# Patient Record
Sex: Female | Born: 1995 | Race: Black or African American | Hispanic: No | Marital: Single | Smoking: Never smoker
Health system: Southern US, Community
[De-identification: ages and names within clinical notes are randomized; demographics above are authoritative.]

---

## 2017-02-12 ENCOUNTER — Emergency Department (HOSPITAL_BASED_OUTPATIENT_CLINIC_OR_DEPARTMENT_OTHER)
Admission: EM | Admit: 2017-02-12 | Discharge: 2017-02-12 | Disposition: A | Discharge status: Home / Self Care | Payer: Self-pay | Attending: Emergency Medicine | Admitting: Emergency Medicine

## 2017-02-12 ENCOUNTER — Other Ambulatory Visit: Payer: Self-pay

## 2017-02-12 ENCOUNTER — Encounter (HOSPITAL_BASED_OUTPATIENT_CLINIC_OR_DEPARTMENT_OTHER): Payer: Self-pay | Admitting: *Deleted

## 2017-02-12 DIAGNOSIS — R197 Diarrhea, unspecified: Secondary | ICD-10-CM

## 2017-02-12 DIAGNOSIS — E860 Dehydration: Secondary | ICD-10-CM | POA: Insufficient documentation

## 2017-02-12 DIAGNOSIS — R112 Nausea with vomiting, unspecified: Secondary | ICD-10-CM

## 2017-02-12 LAB — COMPREHENSIVE METABOLIC PANEL
ALBUMIN: 3.9 g/dL (ref 3.5–5.0)
ALT: 11 U/L — AB (ref 14–54)
AST: 18 U/L (ref 15–41)
Alkaline Phosphatase: 45 U/L (ref 38–126)
Anion gap: 9 (ref 5–15)
BILIRUBIN TOTAL: 0.6 mg/dL (ref 0.3–1.2)
BUN: 7 mg/dL (ref 6–20)
CHLORIDE: 107 mmol/L (ref 101–111)
CO2: 22 mmol/L (ref 22–32)
CREATININE: 0.9 mg/dL (ref 0.44–1.00)
Calcium: 8.9 mg/dL (ref 8.9–10.3)
GFR calc Af Amer: >60 mL/min (ref >60)
GLUCOSE: 95 mg/dL (ref 65–99)
POTASSIUM: 3.7 mmol/L (ref 3.5–5.1)
Sodium: 138 mmol/L (ref 135–145)
Total Protein: 7.6 g/dL (ref 6.5–8.1)

## 2017-02-12 LAB — CBC WITH DIFFERENTIAL/PLATELET
Basophils Absolute: 0 10*3/uL (ref 0.0–0.1)
Basophils Relative: 0 %
EOS ABS: 0 10*3/uL (ref 0.0–0.7)
EOS PCT: 1 %
HCT: 36.3 % (ref 36.0–46.0)
Hemoglobin: 12.1 g/dL (ref 12.0–15.0)
LYMPHS ABS: 1.2 10*3/uL (ref 0.7–4.0)
LYMPHS PCT: 19 %
MCH: 26.9 pg (ref 26.0–34.0)
MCHC: 33.3 g/dL (ref 30.0–36.0)
MCV: 80.7 fL (ref 78.0–100.0)
Monocytes Absolute: 0.5 10*3/uL (ref 0.1–1.0)
Monocytes Relative: 7 %
Neutro Abs: 4.8 10*3/uL (ref 1.7–7.7)
Neutrophils Relative %: 73 %
PLATELETS: 295 10*3/uL (ref 150–400)
RBC: 4.5 MIL/uL (ref 3.87–5.11)
RDW: 13.2 % (ref 11.5–15.5)
WBC: 6.5 10*3/uL (ref 4.0–10.5)

## 2017-02-12 LAB — URINALYSIS, ROUTINE W REFLEX MICROSCOPIC
Bilirubin Urine: NEGATIVE
GLUCOSE, UA: NEGATIVE mg/dL
Ketones, ur: NEGATIVE mg/dL
Leukocytes, UA: NEGATIVE
Nitrite: NEGATIVE
PROTEIN: NEGATIVE mg/dL
Specific Gravity, Urine: >1.03 — ABNORMAL HIGH (ref 1.005–1.030)
pH: 6 (ref 5.0–8.0)

## 2017-02-12 LAB — URINALYSIS, MICROSCOPIC (REFLEX)

## 2017-02-12 LAB — PREGNANCY, URINE: PREG TEST UR: NEGATIVE

## 2017-02-12 MED ORDER — ONDANSETRON HCL 4 MG/2ML IJ SOLN
4.0000 mg | Freq: Once | INTRAMUSCULAR | Status: AC
Start: 2017-02-12 — End: 2017-02-12
  Administered 2017-02-12: 4 mg via INTRAVENOUS
  Filled 2017-02-12: qty 2

## 2017-02-12 MED ORDER — LACTATED RINGERS IV BOLUS (SEPSIS)
1000.0000 mL | Freq: Once | INTRAVENOUS | Status: AC
  Administered 2017-02-12: 1000 mL via INTRAVENOUS

## 2017-02-12 NOTE — ED Notes (Signed)
Patient given water for PO challenge.  

## 2017-02-12 NOTE — ED Triage Notes (Signed)
Pt reports 2 days of n/v/d saw her pcp yesterday, dx with "stomach bug" rx nausea and diarrhea meds, did not pick up her prescriptions because she felt better after her apt, but symptoms returned today. Pt states her niece has had same sx, "I think she gave this to me."

## 2017-02-12 NOTE — ED Provider Notes (Signed)
[facility] EMERGENCY DEPARTMENT Provider Note   CSN: [id number] Arrival date & time: 02/12/17  0831     History   Chief Complaint Chief Complaint  Patient presents with  . Emesis    HPI Rhonda Powers is a 22 y.o. female.  The history is provided by the patient. No language interpreter was used.  Emesis      Rhonda Powers is a 22 y.o. female who presents to the Emergency Department complaining of vomiting/diarrhea.  She reports 2 days have nausea, vomiting, diarrhea.  She has had 2 episodes of emesis daily with numerous episodes of diarrhea.  She does endorse sick contacts with similar symptoms.  She went to her doctor yesterday for the symptoms that she was starting to feel better and did not take the nausea medicine that was prescribed.  This morning she felt significant worsening with recurrent vomiting and diarrhea.  She reports feeling warm at times with occasional chills but no reports of fever.  She reports feeling very tired today.  No dysuria, abdominal pain.  No recent antibiotic use or foreign travel.  She has no medical problems.  Symptoms are moderate, constant, worsening.  History reviewed. No pertinent past medical history.  There are no active problems to display for this patient.   History reviewed. No pertinent surgical history.  OB History    No data available       Home Medications    Prior to Admission medications   Not on File    Family History History reviewed. No pertinent family history.  Social History Social History   Tobacco Use  . Smoking status: Never Smoker  . Smokeless tobacco: Never Used  Substance Use Topics  . Alcohol use: Not on file  . Drug use: Not on file     Allergies   Patient has no known allergies.   Review of Systems Review of Systems  Gastrointestinal: Positive for vomiting.  All other systems reviewed and are negative.    Physical Exam Updated Vital Signs BP (!) 105/56   Pulse (!) 51    Temp 97.9 F (36.6 C) (Oral)   Resp 18   LMP 01/18/2017   SpO2 98%   Physical Exam  Constitutional: She is oriented to person, place, and time. She appears well-developed and well-nourished.  HENT:  Head: Normocephalic and atraumatic.  Cardiovascular: Normal rate and regular rhythm.  No murmur heard. Pulmonary/Chest: Effort normal and breath sounds normal. No respiratory distress.  Abdominal: Soft. There is no tenderness. There is no rebound and no guarding.  Musculoskeletal: She exhibits no edema or tenderness.  Neurological: She is alert and oriented to person, place, and time.  Skin: Skin is warm and dry.  Psychiatric: She has a normal mood and affect. Her behavior is normal.  Nursing note and vitals reviewed.    ED Treatments / Results  Labs (all labs ordered are listed, but only abnormal results are displayed) Labs Reviewed  URINALYSIS, ROUTINE W REFLEX MICROSCOPIC - Abnormal; Notable for the following components:      Result Value   APPearance CLOUDY (*)    Specific Gravity, Urine >1.030 (*)    Hgb urine dipstick MODERATE (*)    All other components within normal limits  URINALYSIS, MICROSCOPIC (REFLEX) - Abnormal; Notable for the following components:   Bacteria, UA MANY (*)    Squamous Epithelial / LPF 6-30 (*)    All other components within normal limits  COMPREHENSIVE METABOLIC PANEL - Abnormal; Notable for the  following components:   ALT 11 (*)    All other components within normal limits  PREGNANCY, URINE  CBC WITH DIFFERENTIAL/PLATELET    EKG  EKG Interpretation None       Radiology No results found.  Procedures Procedures (including critical care time)  Medications Ordered in ED Medications  lactated ringers bolus 1,000 mL (1,000 mLs Intravenous New Bag/Given 02/12/17 0942)  ondansetron (ZOFRAN) injection 4 mg (4 mg Intravenous Given 02/12/17 0942)     Initial Impression / Assessment and Plan / ED Course  I have reviewed the triage vital  signs and the nursing notes.  Pertinent labs & imaging results that were available during my care of the patient were reviewed by me and considered in my medical decision making (see chart for details).     Patient here for evaluation of nausea, vomiting, diarrhea with sick contact with similar symptoms.  Abdominal examination is benign.  Current presentation is not consistent with acute cholecystitis, acute appendicitis, bowel obstruction.  UA is not consistent with UTI in setting of her current symptoms.  UA is concentrated consistent with dehydration, provided IV fluid bolus as well as antiemetic.  On repeat assessment after IV fluid she is feeling improved.  Discussed home care for vomiting and diarrhea, likely gastroenteritis.  Discussed oral fluid hydration, outpatient follow-up and return precautions.  Final Clinical Impressions(s) / ED Diagnoses   Final diagnoses:  Nausea vomiting and diarrhea  Dehydration    ED Discharge Orders    None       Tilden Fossaees, Beverely Suen, MD 02/12/17 1031

## 2018-01-24 ENCOUNTER — Other Ambulatory Visit: Payer: Self-pay

## 2018-01-24 ENCOUNTER — Emergency Department (HOSPITAL_COMMUNITY)
Admission: EM | Admit: 2018-01-24 | Discharge: 2018-01-25 | Disposition: A | Discharge status: Home / Self Care | Payer: BLUE CROSS/BLUE SHIELD | Attending: Emergency Medicine | Admitting: Emergency Medicine

## 2018-01-24 ENCOUNTER — Encounter (HOSPITAL_COMMUNITY): Payer: Self-pay | Admitting: Emergency Medicine

## 2018-01-24 DIAGNOSIS — R112 Nausea with vomiting, unspecified: Secondary | ICD-10-CM | POA: Diagnosis present

## 2018-01-24 DIAGNOSIS — K047 Periapical abscess without sinus: Secondary | ICD-10-CM | POA: Diagnosis not present

## 2018-01-24 DIAGNOSIS — E860 Dehydration: Secondary | ICD-10-CM

## 2018-01-24 NOTE — ED Triage Notes (Addendum)
C/o R lower dental pain/ ? Abscess since yesterday.  Reports vomiting mucous and chills today.  Denies fever.

## 2018-01-24 NOTE — ED Notes (Signed)
Pt reports having an abscess tooth that has been leaking. Pt reports a dental appointment for same next week. Pt was concerned because same stopped draining but started again today. Pt reports taking a 7 hour nap and when she woke up she started to vomit and was sweaty. Pt denies any fever or pain.

## 2018-01-25 LAB — COMPREHENSIVE METABOLIC PANEL
ALBUMIN: 3.9 g/dL (ref 3.5–5.0)
ALK PHOS: 35 U/L — AB (ref 38–126)
ALT: 14 U/L (ref 0–44)
AST: 15 U/L (ref 15–41)
Anion gap: 9 (ref 5–15)
BILIRUBIN TOTAL: 0.6 mg/dL (ref 0.3–1.2)
BUN: 12 mg/dL (ref 6–20)
CO2: 23 mmol/L (ref 22–32)
Calcium: 9 mg/dL (ref 8.9–10.3)
Chloride: 105 mmol/L (ref 98–111)
Creatinine, Ser: 0.87 mg/dL (ref 0.44–1.00)
GFR calc Af Amer: >60 mL/min (ref >60)
GFR calc non Af Amer: >60 mL/min (ref >60)
GLUCOSE: 86 mg/dL (ref 70–99)
POTASSIUM: 3.9 mmol/L (ref 3.5–5.1)
Sodium: 137 mmol/L (ref 135–145)
TOTAL PROTEIN: 7.6 g/dL (ref 6.5–8.1)

## 2018-01-25 LAB — CBC
HCT: 41.6 % (ref 36.0–46.0)
HEMOGLOBIN: 12.9 g/dL (ref 12.0–15.0)
MCH: 26.2 pg (ref 26.0–34.0)
MCHC: 31 g/dL (ref 30.0–36.0)
MCV: 84.6 fL (ref 80.0–100.0)
Platelets: 281 10*3/uL (ref 150–400)
RBC: 4.92 MIL/uL (ref 3.87–5.11)
RDW: 13.5 % (ref 11.5–15.5)
WBC: 7 10*3/uL (ref 4.0–10.5)
nRBC: 0 % (ref 0.0–0.2)

## 2018-01-25 LAB — I-STAT BETA HCG BLOOD, ED (MC, WL, AP ONLY)

## 2018-01-25 LAB — LIPASE, BLOOD: Lipase: 26 U/L (ref 11–51)

## 2018-01-25 MED ORDER — SODIUM CHLORIDE 0.9 % IV BOLUS
1000.0000 mL | Freq: Once | INTRAVENOUS | Status: AC
  Administered 2018-01-25: 1000 mL via INTRAVENOUS

## 2018-01-25 MED ORDER — ONDANSETRON HCL 4 MG/2ML IJ SOLN
4.0000 mg | Freq: Once | INTRAMUSCULAR | Status: AC | PRN
  Administered 2018-01-25: 4 mg via INTRAVENOUS
  Filled 2018-01-25: qty 2

## 2018-01-25 MED ORDER — PROMETHAZINE HCL 25 MG PO TABS: 25.0000 mg | ORAL_TABLET | Freq: Four times a day (QID) | ORAL | 0 refills | Status: AC | PRN

## 2018-01-25 MED ORDER — ONDANSETRON HCL 4 MG/2ML IJ SOLN: 4.0000 mg | Freq: Once | INTRAMUSCULAR | Status: DC

## 2018-01-25 MED ORDER — PENICILLIN V POTASSIUM 500 MG PO TABS: 500.0000 mg | ORAL_TABLET | Freq: Four times a day (QID) | ORAL | 0 refills | Status: DC

## 2018-01-25 NOTE — ED Provider Notes (Signed)
[facility]   CSN: [id number] Arrival date & time: 01/24/18  2207     History   Chief Complaint Chief Complaint  Patient presents with  . Dental Pain  . Emesis    HPI Rhonda Powers is a 22 y.o. female.  HPI Patient presents to the emergency department with nausea and vomiting that started earlier this morning.  The patient states that she had a dental abscess that ruptured last night.  This morning woke up with some chills and nausea vomiting.  The patient states that her mouth feels better does not hurt at this time.  Patient states she did not take any medications prior to arrival for symptoms.  She does have an appointment with her dentist next week.  Patient states that she does not believe she had a fever.  Patient states this time she is having body aches but no nausea.  The patient denies chest pain, shortness of breath, headache,blurred vision, neck pain, fever, cough, weakness, numbness, dizziness, anorexia, edema, abdominal pain, diarrhea, rash, back pain, dysuria, hematemesis, bloody stool, near syncope, or syncope. History reviewed. No pertinent past medical history.  There are no active problems to display for this patient.   History reviewed. No pertinent surgical history.   OB History   No obstetric history on file.      Home Medications    Prior to Admission medications   Not on File    Family History No family history on file.  Social History Social History   Tobacco Use  . Smoking status: Never Smoker  . Smokeless tobacco: Never Used  Substance Use Topics  . Alcohol use: Not Currently  . Drug use: Not Currently     Allergies   Patient has no known allergies.   Review of Systems Review of Systems All other systems negative except as documented in the HPI. All pertinent positives and negatives as reviewed in the HPI.  Physical Exam Updated Vital Signs BP 115/75   Pulse 62   Temp  98.1 F (36.7 C) (Oral)   Resp 18   LMP 12/27/2017   SpO2 100%   Physical Exam Vitals signs and nursing note reviewed.  Constitutional:      General: She is not in acute distress.    Appearance: She is well-developed.  HENT:     Head: Normocephalic and atraumatic.     Mouth/Throat:   Eyes:     Pupils: Pupils are equal, round, and reactive to light.  Neck:     Musculoskeletal: Normal range of motion and neck supple.  Cardiovascular:     Rate and Rhythm: Normal rate and regular rhythm.     Heart sounds: Normal heart sounds. No murmur. No friction rub. No gallop.   Pulmonary:     Effort: Pulmonary effort is normal. No respiratory distress.     Breath sounds: Normal breath sounds. No wheezing.  Skin:    General: Skin is warm and dry.     Capillary Refill: Capillary refill takes less than 2 seconds.     Findings: No erythema or rash.  Neurological:     Mental Status: She is alert and oriented to person, place, and time.     Motor: No abnormal muscle tone.     Coordination: Coordination normal.  Psychiatric:        Behavior: Behavior normal.      ED Treatments / Results  Labs (all labs ordered are listed, but only abnormal results  are displayed) Labs Reviewed  LIPASE, BLOOD  COMPREHENSIVE METABOLIC PANEL  CBC  URINALYSIS, ROUTINE W REFLEX MICROSCOPIC  I-STAT BETA HCG BLOOD, ED ([facility], [facility], [facility] ONLY)    EKG None  Radiology No results found.  Procedures Procedures (including critical care time)  Medications Ordered in ED Medications  ondansetron (ZOFRAN) injection 4 mg (has no administration in time range)     Initial Impression / Assessment and Plan / ED Course  I have reviewed the triage vital signs and the nursing notes.  Pertinent labs & imaging results that were available during my care of the patient were reviewed by me and considered in my medical decision making (see chart for details).     I feel the patient is somewhat dehydrated from the nausea  and vomiting.  She does not have any significant lab abnormalities or vital sign abnormalities at this time.  Patient is given a liter fluid here in the emergency department and will be discharged home with antibiotics for the dental infection.  Final Clinical Impressions(s) / ED Diagnoses   Final diagnoses:  None    ED Discharge Orders    None       Charlestine NightLawyer, Melisia Leming, PA-C 01/25/18 0144    Zadie RhineWickline, Donald, MD 01/25/18 [phone]

## 2018-01-25 NOTE — Discharge Instructions (Addendum)
Return here as needed. Follow up with your dentist.  °

## 2018-01-25 NOTE — ED Notes (Signed)
Patient verbalizes understanding of discharge instructions. Opportunity for questioning and answers were provided. Armband removed by staff, pt discharged from ED home via POV with family. 

## 2018-02-03 ENCOUNTER — Other Ambulatory Visit: Payer: Self-pay

## 2018-02-03 ENCOUNTER — Encounter (HOSPITAL_BASED_OUTPATIENT_CLINIC_OR_DEPARTMENT_OTHER): Payer: Self-pay | Admitting: *Deleted

## 2018-02-03 ENCOUNTER — Emergency Department (HOSPITAL_BASED_OUTPATIENT_CLINIC_OR_DEPARTMENT_OTHER)
Admission: EM | Admit: 2018-02-03 | Discharge: 2018-02-03 | Disposition: A | Discharge status: Home / Self Care | Payer: BLUE CROSS/BLUE SHIELD | Attending: Emergency Medicine | Admitting: Emergency Medicine

## 2018-02-03 ENCOUNTER — Emergency Department (HOSPITAL_BASED_OUTPATIENT_CLINIC_OR_DEPARTMENT_OTHER): Payer: BLUE CROSS/BLUE SHIELD

## 2018-02-03 DIAGNOSIS — M7918 Myalgia, other site: Secondary | ICD-10-CM | POA: Insufficient documentation

## 2018-02-03 DIAGNOSIS — R05 Cough: Secondary | ICD-10-CM | POA: Diagnosis present

## 2018-02-03 DIAGNOSIS — R0602 Shortness of breath: Secondary | ICD-10-CM | POA: Diagnosis not present

## 2018-02-03 DIAGNOSIS — R0981 Nasal congestion: Secondary | ICD-10-CM | POA: Diagnosis not present

## 2018-02-03 DIAGNOSIS — J069 Acute upper respiratory infection, unspecified: Secondary | ICD-10-CM | POA: Diagnosis not present

## 2018-02-03 DIAGNOSIS — R5383 Other fatigue: Secondary | ICD-10-CM | POA: Insufficient documentation

## 2018-02-03 MED ORDER — CYCLOBENZAPRINE HCL 10 MG PO TABS: 10.0000 mg | ORAL_TABLET | Freq: Three times a day (TID) | ORAL | 0 refills | Status: AC | PRN

## 2018-02-03 NOTE — ED Provider Notes (Signed)
[facility] EMERGENCY DEPARTMENT Provider Note    CSN: [id number] Arrival date & time: 02/03/18  1337     History   Chief Complaint Chief Complaint  Patient presents with  . URI    HPI Rhonda Powers is a 23 y.o. female who presents today for cough, congestion, mild sore throat, myalgias and shortness of breath. Patient reports she has had these symptoms for the past 3 days. She was seen at urgent care 2 days ago and tested negative for flu.  She reports her roommate had the flu a few days ago.  Patient feels fatigued and has body ache.  Patient has taken over-the-counter cold medication with no real improvement in symptoms.  Patient denies any fevers, chills, chest pain, abdominal pain, diarrhea, headaches.   HPI  History reviewed. No pertinent past medical history.  There are no active problems to display for this patient.   History reviewed. No pertinent surgical history.   OB History   No obstetric history on file.      Home Medications    Prior to Admission medications   Medication Sig Start Date End Date Taking? Authorizing Provider  cyclobenzaprine (FLEXERIL) 10 MG tablet Take 1 tablet (10 mg total) by mouth 3 (three) times daily as needed for muscle spasms. 02/03/18   Kennette Cuthrell, Lilia Argue, MD  promethazine (PHENERGAN) 25 MG tablet Take 1 tablet (25 mg total) by mouth every 6 (six) hours as needed for nausea or vomiting. 01/25/18   Charlestine Night, PA-C    Family History History reviewed. No pertinent family history.  Social History Social History   Tobacco Use  . Smoking status: Never Smoker  . Smokeless tobacco: Never Used  Substance Use Topics  . Alcohol use: Not Currently  . Drug use: Not Currently     Allergies   Patient has no known allergies.   Review of Systems Review of Systems  Constitutional: Positive for fatigue.  HENT: Positive for congestion, rhinorrhea and sinus pain.   Eyes: Negative.   Respiratory: Positive for  shortness of breath.   Cardiovascular: Negative.   Gastrointestinal: Negative.   Endocrine: Negative.   Genitourinary: Negative.      Physical Exam Updated Vital Signs BP 132/84 (BP Location: Right Arm)   Pulse 84   Temp 99.1 F (37.3 C) (Oral)   Resp 18   SpO2 100%   Physical Exam Constitutional:      Appearance: Normal appearance.  HENT:     Head: Normocephalic and atraumatic.     Mouth/Throat:     Mouth: Mucous membranes are moist.  Neck:     Musculoskeletal: Normal range of motion.  Cardiovascular:     Rate and Rhythm: Normal rate.     Pulses: Normal pulses.  Pulmonary:     Effort: Pulmonary effort is normal.     Comments: Mildly diminished breath sounds right lower lung Abdominal:     General: Abdomen is flat.     Palpations: Abdomen is soft.  Musculoskeletal: Normal range of motion.  Skin:    General: Skin is warm.     Capillary Refill: Capillary refill takes less than 2 seconds.  Neurological:     General: No focal deficit present.     Mental Status: She is alert.     ED Treatments / Results  Labs (all labs ordered are listed, but only abnormal results are displayed) Labs Reviewed - No data to display  EKG None  Radiology Dg Chest 2 View  Result Date: 02/03/2018  CLINICAL DATA:  Short of breath EXAM: CHEST - 2 VIEW COMPARISON:  None. FINDINGS: The heart size and mediastinal contours are within normal limits. Both lungs are clear. The visualized skeletal structures are unremarkable. IMPRESSION: No active cardiopulmonary disease. Electronically Signed   By: Marlan Palau M.D.   On: 02/03/2018 14:54    Procedures Procedures (including critical care time)  Medications Ordered in ED Medications - No data to display   Initial Impression / Assessment and Plan / ED Course  I have reviewed the triage vital signs and the nursing notes.  Pertinent labs & imaging results that were available during my care of the patient were reviewed by me and considered  in my medical decision making (see chart for details).   Patient is a 23 year old female presents with cough, congestion, myalgia and mild shortness of breath for the past 4 days.  Recently seen in urgent care tested negative for flu.  Symptoms have not improved with OTC medication.  On exam she is mildly diminished around the right lower lobe.  Chest x-ray was negative for any infiltrate.  Symptoms consistent with viral URI.  Did not retest patient for flu since it was negative 2 days ago.  Vital signs are within normal limits with no fever noted.  We will continue supportive measures and will prescribe Flexeril for myalgias.  Patient will follow-up with PCP if symptoms do not improve or worsen.  Final Clinical Impressions(s) / ED Diagnoses   Final diagnoses:  Upper respiratory tract infection, unspecified type    ED Discharge Orders         Ordered    cyclobenzaprine (FLEXERIL) 10 MG tablet  3 times daily PRN     02/03/18 1452           Lovena Neighbours, MD 02/03/18 1519    Alvira Monday, MD 02/05/18 [phone]

## 2018-02-03 NOTE — ED Triage Notes (Addendum)
Pt c/o URi symptoms x 4 days , seen by [facility]  Yesterday , " they did nothing "  DX URI

## 2018-02-03 NOTE — Discharge Instructions (Signed)
Your symptoms are most consistent with a viral URI. CXR was negative for pneumonia. We are prescribing a muscle relaxer to help you with you body aches. We will recommend supportive measures with hydration and rest. Mucinex for congestion. Tylenol or ibuprofen for fever. Follow up with your PCP as needed.

## 2019-08-12 ENCOUNTER — Encounter (HOSPITAL_COMMUNITY): Payer: Self-pay

## 2019-08-12 ENCOUNTER — Other Ambulatory Visit: Payer: Self-pay

## 2019-08-12 ENCOUNTER — Emergency Department (HOSPITAL_COMMUNITY)
Admission: EM | Admit: 2019-08-12 | Discharge: 2019-08-12 | Disposition: A | Discharge status: Home / Self Care | Payer: BLUE CROSS/BLUE SHIELD | Attending: Emergency Medicine | Admitting: Emergency Medicine

## 2019-08-12 DIAGNOSIS — R531 Weakness: Secondary | ICD-10-CM | POA: Insufficient documentation

## 2019-08-12 DIAGNOSIS — M791 Myalgia, unspecified site: Secondary | ICD-10-CM | POA: Diagnosis not present

## 2019-08-12 DIAGNOSIS — Z5321 Procedure and treatment not carried out due to patient leaving prior to being seen by health care provider: Secondary | ICD-10-CM | POA: Diagnosis not present

## 2019-08-12 DIAGNOSIS — R112 Nausea with vomiting, unspecified: Secondary | ICD-10-CM | POA: Insufficient documentation

## 2019-08-12 LAB — URINALYSIS, ROUTINE W REFLEX MICROSCOPIC
Bilirubin Urine: NEGATIVE
Glucose, UA: NEGATIVE mg/dL
Ketones, ur: 20 mg/dL — AB
Leukocytes,Ua: NEGATIVE
Nitrite: NEGATIVE
Protein, ur: NEGATIVE mg/dL
Specific Gravity, Urine: 1.01 (ref 1.005–1.030)
pH: 7 (ref 5.0–8.0)

## 2019-08-12 LAB — CBC
HCT: 38.9 % (ref 36.0–46.0)
Hemoglobin: 12.2 g/dL (ref 12.0–15.0)
MCH: 26.4 pg (ref 26.0–34.0)
MCHC: 31.4 g/dL (ref 30.0–36.0)
MCV: 84.2 fL (ref 80.0–100.0)
Platelets: 286 10*3/uL (ref 150–400)
RBC: 4.62 MIL/uL (ref 3.87–5.11)
RDW: 13.4 % (ref 11.5–15.5)
WBC: 6.7 10*3/uL (ref 4.0–10.5)
nRBC: 0 % (ref 0.0–0.2)

## 2019-08-12 LAB — I-STAT BETA HCG BLOOD, ED (MC, WL, AP ONLY): I-stat hCG, quantitative: <5 m[IU]/mL (ref <5)

## 2019-08-12 LAB — COMPREHENSIVE METABOLIC PANEL
ALT: 13 U/L (ref 0–44)
AST: 15 U/L (ref 15–41)
Albumin: 3.8 g/dL (ref 3.5–5.0)
Alkaline Phosphatase: 33 U/L — ABNORMAL LOW (ref 38–126)
Anion gap: 9 (ref 5–15)
BUN: <5 mg/dL — ABNORMAL LOW (ref 6–20)
CO2: 23 mmol/L (ref 22–32)
Calcium: 8.9 mg/dL (ref 8.9–10.3)
Chloride: 102 mmol/L (ref 98–111)
Creatinine, Ser: 0.87 mg/dL (ref 0.44–1.00)
GFR calc Af Amer: >60 mL/min (ref >60)
GFR calc non Af Amer: >60 mL/min (ref >60)
Glucose, Bld: 94 mg/dL (ref 70–99)
Potassium: 3.6 mmol/L (ref 3.5–5.1)
Sodium: 134 mmol/L — ABNORMAL LOW (ref 135–145)
Total Bilirubin: 0.3 mg/dL (ref 0.3–1.2)
Total Protein: 7.3 g/dL (ref 6.5–8.1)

## 2019-08-12 LAB — LIPASE, BLOOD: Lipase: 23 U/L (ref 11–51)

## 2019-08-12 MED ORDER — ONDANSETRON 4 MG PO TBDP
4.0000 mg | ORAL_TABLET | Freq: Once | ORAL | Status: AC | PRN
  Administered 2019-08-12: 16:00:00 4 mg via ORAL
  Filled 2019-08-12: qty 1

## 2019-08-12 MED ORDER — SODIUM CHLORIDE 0.9% FLUSH: 3.0000 mL | Freq: Once | INTRAVENOUS | Status: DC

## 2019-08-12 MED ORDER — ACETAMINOPHEN 325 MG PO TABS
650.0000 mg | ORAL_TABLET | Freq: Once | ORAL | Status: AC | PRN
  Administered 2019-08-12: 650 mg via ORAL
  Filled 2019-08-12: qty 2

## 2019-08-12 NOTE — ED Notes (Signed)
Pt stated her decision to leave [facility] ED, saying that she felt physical improvement with the decrease in temperature. This NT encouraged pt to remain and see a provider and reminded pt that her wait time in the [facility] ED would restart if she chose to leave and later return. Pt acknowledged this and left the facility.

## 2019-08-12 NOTE — ED Triage Notes (Signed)
Pt arrives to ED w/ c/o n/v, generalized weakness and body aches that started after receiving 2nd covid vaccine yesterday.

## 2020-07-20 IMAGING — CR DG CHEST 2V
2 series · 2 of 2 positions shown · non-contrast
Comparison: None.

CLINICAL DATA: Short of breath

EXAM:
CHEST - 2 VIEW

[w chest pa]
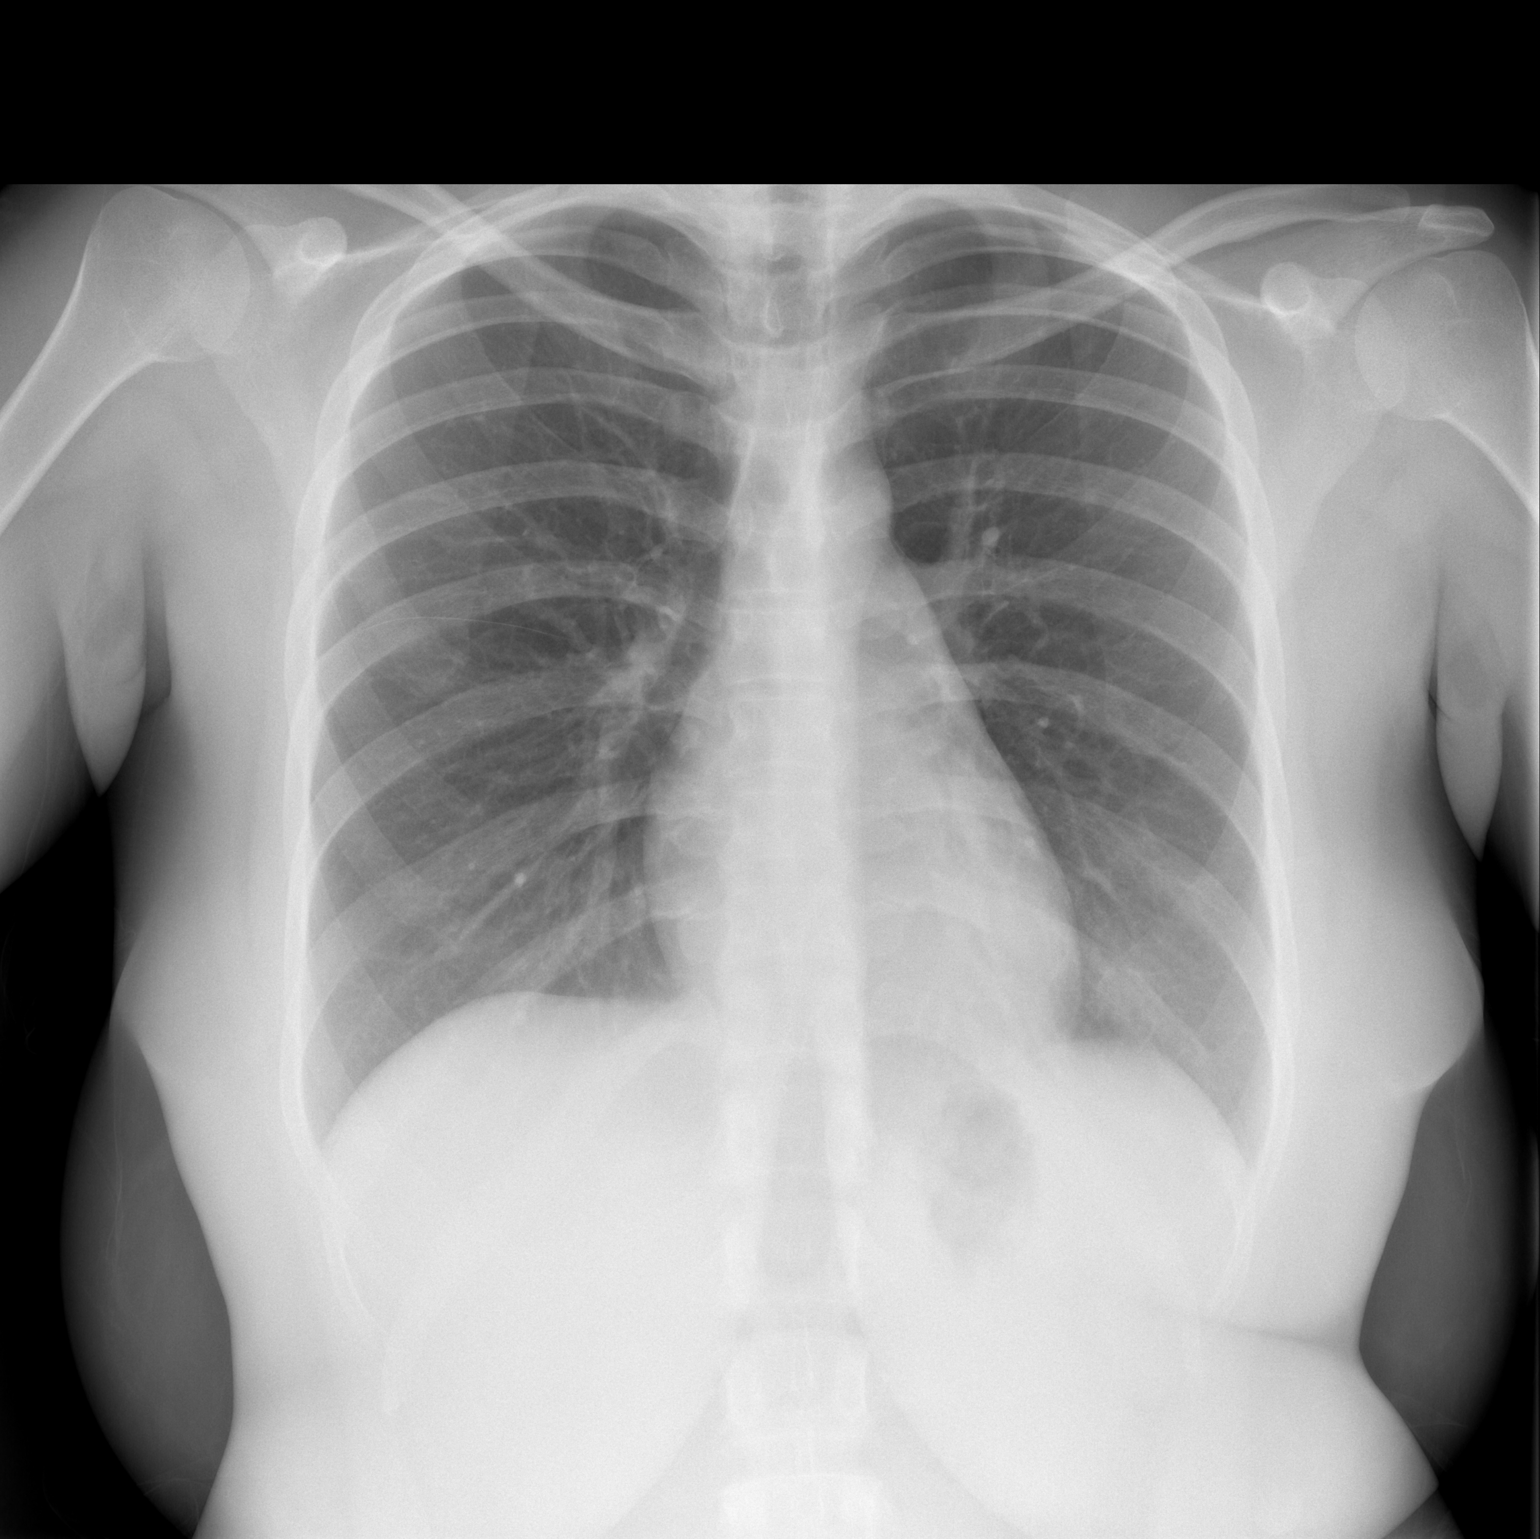

[w chest lat]
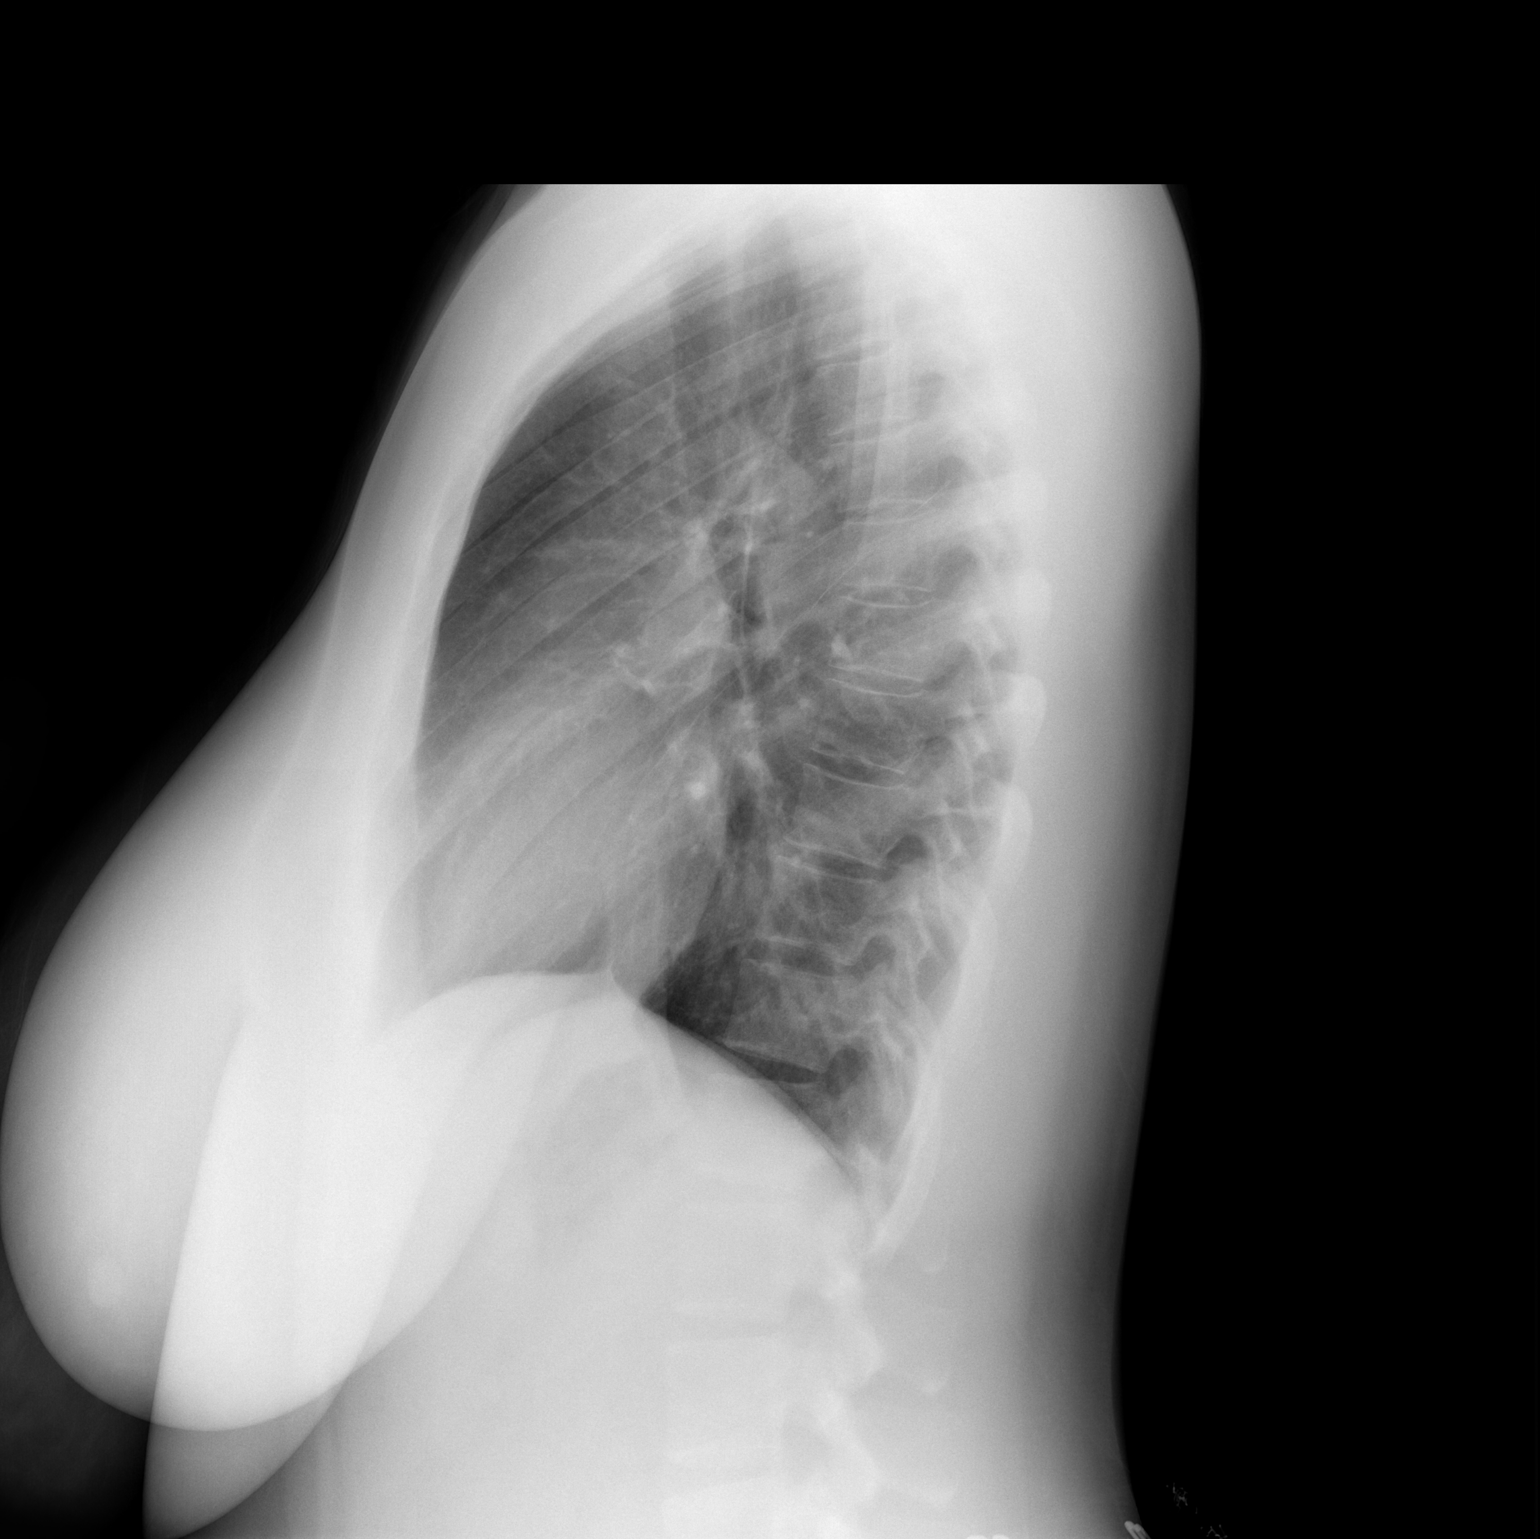

[2 of 2 positions shown; findings below may reference images not displayed]

FINDINGS: The heart size and mediastinal contours are within normal limits.
Both lungs are clear. The visualized skeletal structures are
unremarkable.
IMPRESSION: No active cardiopulmonary disease.

## 2020-09-08 ENCOUNTER — Emergency Department (HOSPITAL_COMMUNITY)
Admission: EM | Admit: 2020-09-08 | Discharge: 2020-09-08 | Disposition: A | Discharge status: Home / Self Care | Payer: BLUE CROSS/BLUE SHIELD | Attending: Emergency Medicine | Admitting: Emergency Medicine

## 2020-09-08 ENCOUNTER — Encounter (HOSPITAL_COMMUNITY): Payer: Self-pay | Admitting: Emergency Medicine

## 2020-09-08 DIAGNOSIS — R1110 Vomiting, unspecified: Secondary | ICD-10-CM | POA: Insufficient documentation

## 2020-09-08 DIAGNOSIS — U071 COVID-19: Secondary | ICD-10-CM | POA: Insufficient documentation

## 2020-09-08 DIAGNOSIS — R519 Headache, unspecified: Secondary | ICD-10-CM | POA: Diagnosis present

## 2020-09-08 DIAGNOSIS — J069 Acute upper respiratory infection, unspecified: Secondary | ICD-10-CM

## 2020-09-08 LAB — RESP PANEL BY RT-PCR (FLU A&B, COVID) ARPGX2
Influenza A by PCR: NEGATIVE
Influenza B by PCR: NEGATIVE
SARS Coronavirus 2 by RT PCR: POSITIVE — AB

## 2020-09-08 MED ORDER — ONDANSETRON 4 MG PO TBDP
4.0000 mg | ORAL_TABLET | Freq: Three times a day (TID) | ORAL | 0 refills | Status: AC | PRN
Start: 2020-09-08 — End: ?

## 2020-09-08 NOTE — ED Provider Notes (Signed)
[facility] EMERGENCY DEPARTMENT Provider Note   CSN: [id number] Arrival date & time: 09/08/20  1305     History No chief complaint on file.   Azrielle Linden is a 25 y.o. female.  25 year old female presents with complaint of headache, vomiting, chills with body aches and cough onset last night with max temp of 102.  Patient took Tylenol prior to arrival, is fully vaccinated against COVID, no specific COVID exposures.  Rayana Geurin was evaluated in Emergency Department on 09/08/2020 for the symptoms described in the history of present illness. She was evaluated in the context of the global COVID-19 pandemic, which necessitated consideration that the patient might be at risk for infection with the SARS-CoV-2 virus that causes COVID-19. Institutional protocols and algorithms that pertain to the evaluation of patients at risk for COVID-19 are in a state of rapid change based on information released by regulatory bodies including the CDC and federal and state organizations. These policies and algorithms were followed during the patient's care in the ED.       History reviewed. No pertinent past medical history.  There are no problems to display for this patient.   History reviewed. No pertinent surgical history.   OB History   No obstetric history on file.     No family history on file.  Social History   Tobacco Use   Smoking status: Never   Smokeless tobacco: Never  Substance Use Topics   Alcohol use: Not Currently   Drug use: Not Currently    Home Medications Prior to Admission medications   Medication Sig Start Date End Date Taking? Authorizing Provider  ondansetron (ZOFRAN ODT) 4 MG disintegrating tablet Take 1 tablet (4 mg total) by mouth every 8 (eight) hours as needed for nausea or vomiting. 09/08/20  Yes Jeannie Fend, PA-C  cyclobenzaprine (FLEXERIL) 10 MG tablet Take 1 tablet (10 mg total) by mouth 3 (three) times daily as needed for muscle  spasms. 02/03/18   Diallo, Lilia Argue, MD  promethazine (PHENERGAN) 25 MG tablet Take 1 tablet (25 mg total) by mouth every 6 (six) hours as needed for nausea or vomiting. 01/25/18   Charlestine Night, PA-C    Allergies    Patient has no known allergies.  Review of Systems   Review of Systems  Constitutional:  Positive for chills and fever.  HENT:  Positive for sore throat.   Respiratory:  Positive for cough.   Gastrointestinal:  Positive for nausea and vomiting. Negative for abdominal pain, constipation and diarrhea.  Genitourinary:  Negative for dysuria.  Musculoskeletal:  Positive for arthralgias and myalgias.  Skin:  Negative for rash and wound.  Allergic/Immunologic: Negative for immunocompromised state.  Neurological:  Positive for headaches.  Hematological:  Negative for adenopathy.  Psychiatric/Behavioral:  Negative for confusion.   All other systems reviewed and are negative.  Physical Exam Updated Vital Signs BP 129/81 (BP Location: Right Arm)   Pulse (!) 117   Temp (!) 100.9 F (38.3 C)   Resp 19   LMP 09/01/2020   SpO2 100%   Physical Exam Vitals and nursing note reviewed.  Constitutional:      General: She is not in acute distress.    Appearance: She is well-developed. She is not diaphoretic.  HENT:     Head: Normocephalic and atraumatic.     Nose: Nose normal.     Mouth/Throat:     Mouth: Mucous membranes are moist.     Pharynx: No oropharyngeal exudate or posterior  oropharyngeal erythema.  Eyes:     Conjunctiva/sclera: Conjunctivae normal.  Cardiovascular:     Rate and Rhythm: Normal rate and regular rhythm.     Heart sounds: Normal heart sounds.  Pulmonary:     Effort: Pulmonary effort is normal.     Breath sounds: Normal breath sounds.  Musculoskeletal:     Cervical back: Neck supple.  Lymphadenopathy:     Cervical: No cervical adenopathy.  Skin:    General: Skin is warm and dry.     Findings: No erythema or rash.  Neurological:     Mental  Status: She is alert and oriented to person, place, and time.  Psychiatric:        Behavior: Behavior normal.    ED Results / Procedures / Treatments   Labs (all labs ordered are listed, but only abnormal results are displayed) Labs Reviewed  RESP PANEL BY RT-PCR (FLU A&B, COVID) [id number]    EKG None  Radiology No results found.  Procedures Procedures   Medications Ordered in ED Medications - No data to display  ED Course  I have reviewed the triage vital signs and the nursing notes.  Pertinent labs & imaging results that were available during my care of the patient were reviewed by me and considered in my medical decision making (see chart for details).  Clinical Course as of 09/08/20 1434  Sat Sep 08, 2020  [id number] 25 year old female with complaint as above.  Well-appearing on exam, lungs clear. She is mildly febrile on arrival with a temp of 100.9, heart rate correlates with her temperature at 117, room air O2 sat 100%. Plan is for COVID test, advised to follow-up in MyChart account for test results.  Advised to quarantine pending results.  May take Motrin and Tylenol as needed as directed.  Given prescription for Zofran for nausea and vomiting.  Return to ED for worsening or concerning symptoms otherwise recheck with PCP. [LM]    Clinical Course User Index [LM] Alden Hipp   MDM Rules/Calculators/A&P                           Final Clinical Impression(s) / ED Diagnoses Final diagnoses:  Viral URI with cough    Rx / DC Orders ED Discharge Orders          Ordered    ondansetron (ZOFRAN ODT) 4 MG disintegrating tablet  Every 8 hours PRN        09/08/20 1322             Jeannie Fend, PA-C 09/08/20 1434    Sloan Leiter, DO 09/09/20 2201

## 2020-09-08 NOTE — ED Triage Notes (Signed)
Pt reports headache, vomiting, chills, and body aches since last night.  Fever was up to 102.  Took Tylenol.  Covid vaccinated.  Has not been COVID tested in the last 2 weeks.

## 2020-09-08 NOTE — ED Notes (Signed)
Discharged by PA at triage. 

## 2020-09-08 NOTE — Discharge Instructions (Signed)
Home to quarantine pending COVID test results which will be available in your MyChart account later today. Motrin and Tylenol as needed as directed. Mucinex and other OTC medications as needed as directed. Recheck with your doctor as needed.

## 2021-05-09 ENCOUNTER — Ambulatory Visit: Payer: BLUE CROSS/BLUE SHIELD | Admitting: Family Medicine

## 2021-06-11 ENCOUNTER — Ambulatory Visit: Payer: BLUE CROSS/BLUE SHIELD | Admitting: Family Medicine

## 2021-12-12 ENCOUNTER — Other Ambulatory Visit: Payer: Self-pay | Admitting: Physician Assistant

## 2021-12-12 DIAGNOSIS — R221 Localized swelling, mass and lump, neck: Secondary | ICD-10-CM

## 2021-12-16 ENCOUNTER — Ambulatory Visit
Admission: RE | Admit: 2021-12-16 | Discharge: 2021-12-16 | Disposition: A | Discharge status: Home / Self Care | Payer: 59 | Source: Ambulatory Visit | Attending: Physician Assistant | Admitting: Physician Assistant

## 2021-12-16 DIAGNOSIS — R221 Localized swelling, mass and lump, neck: Secondary | ICD-10-CM

## 2023-07-07 ENCOUNTER — Ambulatory Visit: Admitting: Internal Medicine
